# Patient Record
Sex: Female | Born: 2000 | Race: Black or African American | Hispanic: No | Marital: Single | Smoking: Never smoker
Health system: Southern US, Community
[De-identification: ages and names within clinical notes are randomized; demographics above are authoritative.]

## PROBLEM LIST (undated history)

## (undated) DIAGNOSIS — E669 Obesity, unspecified: Secondary | ICD-10-CM

## (undated) DIAGNOSIS — R7309 Other abnormal glucose: Secondary | ICD-10-CM

## (undated) DIAGNOSIS — T7840XA Allergy, unspecified, initial encounter: Secondary | ICD-10-CM

## (undated) HISTORY — DX: Allergy, unspecified, initial encounter: T78.40XA

## (undated) HISTORY — DX: Other abnormal glucose: R73.09

## (undated) HISTORY — DX: Obesity, unspecified: E66.9

---
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
## 2024-01-03 ENCOUNTER — Ambulatory Visit: Admitting: Podiatry

## 2024-01-03 DIAGNOSIS — Q666 Other congenital valgus deformities of feet: Secondary | ICD-10-CM | POA: Diagnosis not present

## 2024-01-03 DIAGNOSIS — M722 Plantar fascial fibromatosis: Secondary | ICD-10-CM

## 2024-01-03 NOTE — Progress Notes (Unsigned)
 Right PF injection brace  Pesplanovalgus orothics

## 2024-01-31 ENCOUNTER — Telehealth: Payer: Self-pay | Admitting: Podiatry

## 2024-01-31 NOTE — Telephone Encounter (Signed)
 Spoke to [employer].  Advised orthotics are available for pick-up in the [place] office. They scheduled an appt on 02/05/24 at 10:45 a.m. Advised they have an appt with Dr. Tobie the same day at 9:15 am; they can likely PUO after they check-out from that appt

## 2024-02-05 ENCOUNTER — Ambulatory Visit: Admitting: Podiatry

## 2024-02-05 ENCOUNTER — Ambulatory Visit (INDEPENDENT_AMBULATORY_CARE_PROVIDER_SITE_OTHER): Admitting: Podiatrist

## 2024-02-05 DIAGNOSIS — Q666 Other congenital valgus deformities of feet: Secondary | ICD-10-CM

## 2024-02-05 DIAGNOSIS — M722 Plantar fascial fibromatosis: Secondary | ICD-10-CM

## 2024-02-05 NOTE — Progress Notes (Signed)

## 2024-02-21 ENCOUNTER — Ambulatory Visit: Admitting: Podiatry

## 2024-02-21 NOTE — Progress Notes (Unsigned)
 Second inectoin   Orhotics dispsed
